# Patient Record
Sex: Male | Born: 1950 | Race: White | Hispanic: No | Marital: Married | Smoking: Former smoker
Health system: Southern US, Community
[De-identification: ages and names within clinical notes are randomized; demographics above are authoritative.]

---

## 2009-06-18 ENCOUNTER — Ambulatory Visit (HOSPITAL_COMMUNITY): Admission: RE | Admit: 2009-06-18 | Discharge: 2009-06-19 | Payer: Self-pay | Admitting: Neurological Surgery

## 2010-03-23 LAB — BASIC METABOLIC PANEL
CO2: 30 mEq/L (ref 19–32)
Calcium: 9.1 mg/dL (ref 8.4–10.5)
Creatinine, Ser: 1.05 mg/dL (ref 0.4–1.5)
GFR calc Af Amer: >60 mL/min (ref >60)
GFR calc non Af Amer: >60 mL/min (ref >60)

## 2010-03-23 LAB — DIFFERENTIAL
Eosinophils Relative: 6 % — ABNORMAL HIGH (ref 0–5)
Lymphocytes Relative: 30 % (ref 12–46)
Lymphs Abs: 1.6 10*3/uL (ref 0.7–4.0)
Monocytes Absolute: 0.6 10*3/uL (ref 0.1–1.0)
Monocytes Relative: 11 % (ref 3–12)
Neutro Abs: 2.8 10*3/uL (ref 1.7–7.7)

## 2010-03-23 LAB — SURGICAL PCR SCREEN
MRSA, PCR: NEGATIVE
Staphylococcus aureus: POSITIVE — AB

## 2010-03-23 LAB — CBC
MCV: 93.3 fL (ref 78.0–100.0)
Platelets: 209 10*3/uL (ref 150–400)
RBC: 4.55 MIL/uL (ref 4.22–5.81)
WBC: 5.3 10*3/uL (ref 4.0–10.5)

## 2010-03-23 LAB — PROTIME-INR
INR: 1.02 (ref 0.00–1.49)
Prothrombin Time: 13.3 seconds (ref 11.6–15.2)

## 2012-04-26 ENCOUNTER — Other Ambulatory Visit: Payer: Self-pay | Admitting: Neurological Surgery

## 2012-04-26 DIAGNOSIS — M549 Dorsalgia, unspecified: Secondary | ICD-10-CM

## 2012-05-02 ENCOUNTER — Other Ambulatory Visit: Payer: Self-pay

## 2012-05-04 ENCOUNTER — Ambulatory Visit
Admission: RE | Admit: 2012-05-04 | Discharge: 2012-05-04 | Disposition: A | Discharge status: Home / Self Care | Payer: No Typology Code available for payment source | Source: Ambulatory Visit | Attending: Neurological Surgery | Admitting: Neurological Surgery

## 2012-05-04 VITALS — BP 127/65 | HR 66 | Ht 69.0 in | Wt 205.0 lb

## 2012-05-04 DIAGNOSIS — M549 Dorsalgia, unspecified: Secondary | ICD-10-CM

## 2012-05-04 MED ORDER — DIAZEPAM 5 MG PO TABS
10.0000 mg | ORAL_TABLET | Freq: Once | ORAL | Status: AC
  Administered 2012-05-04: 10 mg via ORAL

## 2012-05-04 MED ORDER — ONDANSETRON HCL 4 MG/2ML IJ SOLN
4.0000 mg | Freq: Once | INTRAMUSCULAR | Status: AC
  Administered 2012-05-04: 4 mg via INTRAMUSCULAR

## 2012-05-04 MED ORDER — IOHEXOL 180 MG/ML  SOLN
17.0000 mL | Freq: Once | INTRAMUSCULAR | Status: AC | PRN
  Administered 2012-05-04: 17 mL via INTRATHECAL

## 2012-05-04 MED ORDER — MEPERIDINE HCL 100 MG/ML IJ SOLN
100.0000 mg | Freq: Once | INTRAMUSCULAR | Status: AC
  Administered 2012-05-04: 100 mg via INTRAMUSCULAR

## 2012-05-04 NOTE — Progress Notes (Signed)
Pt states he has been off  cymbalta and adderal for the past 3 days.

## 2013-07-12 ENCOUNTER — Other Ambulatory Visit: Payer: Self-pay | Admitting: Neurological Surgery

## 2013-07-12 DIAGNOSIS — M47812 Spondylosis without myelopathy or radiculopathy, cervical region: Secondary | ICD-10-CM

## 2013-10-23 ENCOUNTER — Other Ambulatory Visit: Payer: Self-pay | Admitting: Neurological Surgery

## 2013-10-23 ENCOUNTER — Ambulatory Visit (INDEPENDENT_AMBULATORY_CARE_PROVIDER_SITE_OTHER): Payer: No Typology Code available for payment source

## 2013-10-23 DIAGNOSIS — K8080 Other cholelithiasis without obstruction: Secondary | ICD-10-CM

## 2013-10-23 DIAGNOSIS — M5136 Other intervertebral disc degeneration, lumbar region: Secondary | ICD-10-CM

## 2013-10-23 DIAGNOSIS — M549 Dorsalgia, unspecified: Secondary | ICD-10-CM

## 2013-10-23 DIAGNOSIS — M4606 Spinal enthesopathy, lumbar region: Secondary | ICD-10-CM

## 2016-01-22 IMAGING — CR DG LUMBAR SPINE 2-3V
4 series · 4 of 4 positions shown · non-contrast
Comparison: 05/24/2013

CLINICAL DATA: Bilateral lower back pain.

EXAM:
LUMBAR SPINE - 2-3 VIEW

[view not recorded (1 of 4)]
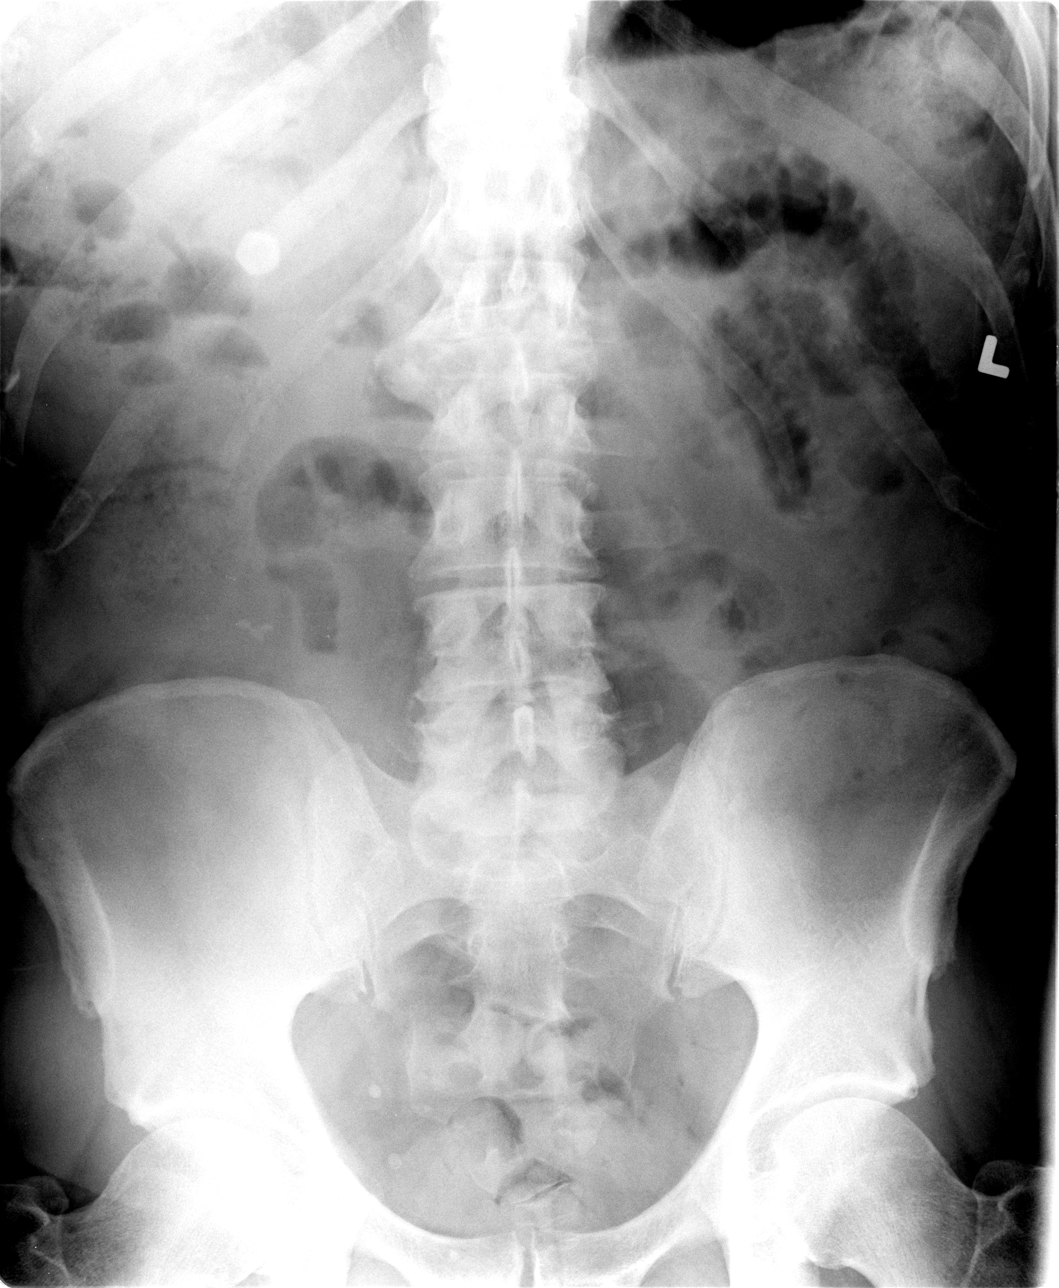

[view not recorded (2 of 4)]
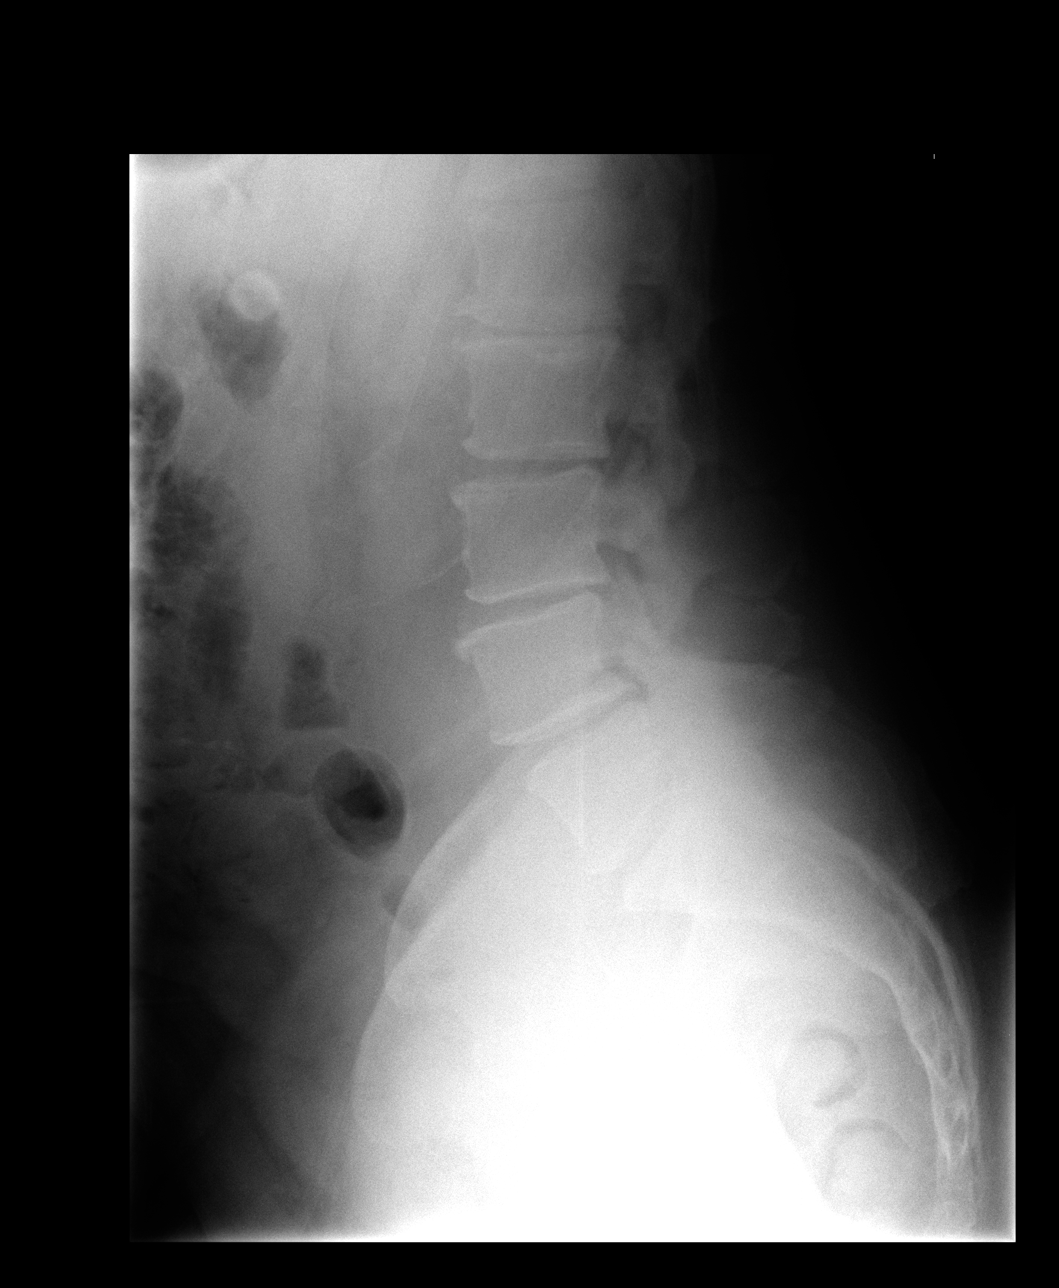

[view not recorded (3 of 4)]
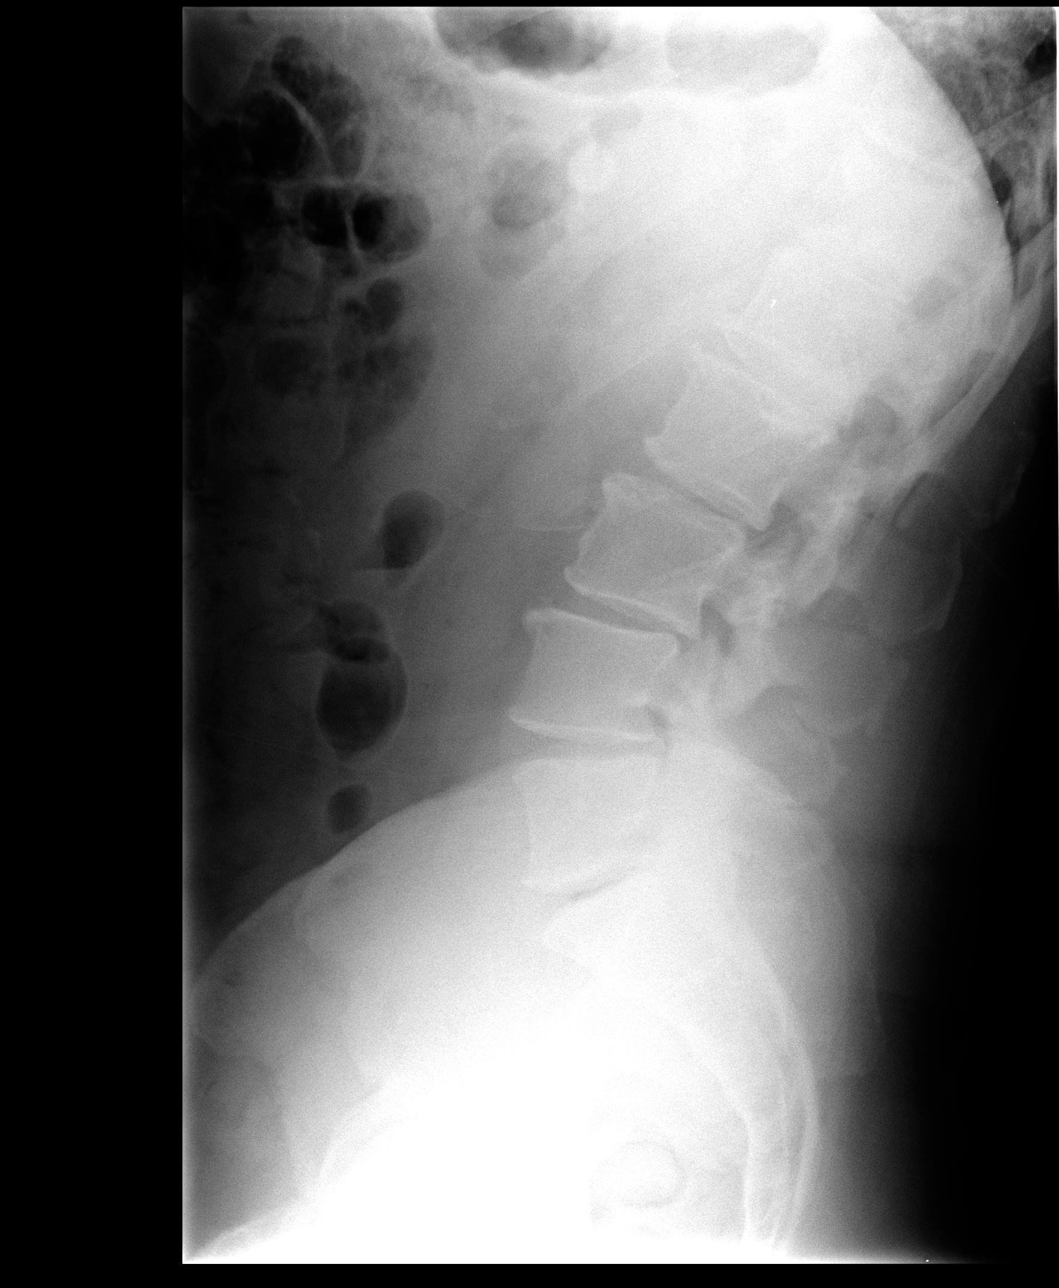

[view not recorded (4 of 4)]
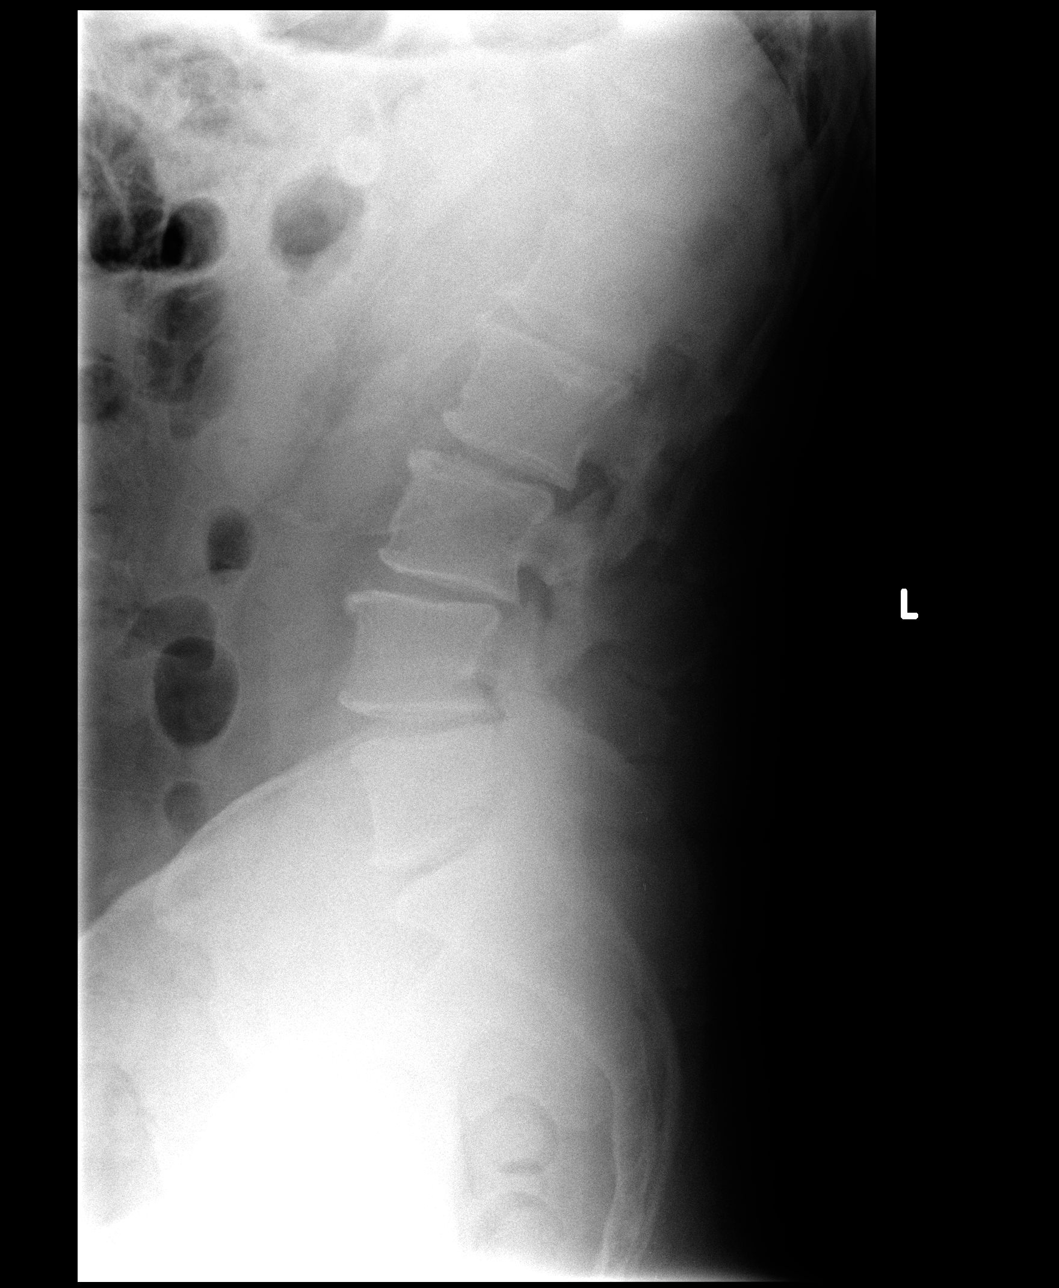

[4 of 4 positions shown; findings below may reference images not displayed]

FINDINGS: 1.4 cm right upper quadrant calcification favoring gallstone.

On the image marked neutral, there is mildly exaggerated lumbar
lordosis with 4 mm degenerative retrolisthesis at L2-3, 4 mm
retrolisthesis at L3-4, and 3 mm anterolisthesis at L4-5. Multilevel
intervertebral spurring.

On flexion, the subluxation is not observed change, although flexion
is mildly limited.

Extension is similarly mildly limited, and there is no abnormal
motion.
IMPRESSION: 1. Grade 1 degenerative subluxations at L2-3, L3-4, and L4-5 which
do not shift significantly with flexion or extension.
2. 1.4 cm gallstone.
3. Mild multilevel loss of intervertebral disc height along with
multilevel intervertebral spurring.
